# Patient Record
Sex: Female | Born: 1955 | Race: White | Hispanic: No | Smoking: Current every day smoker
Health system: Southern US, Community
[De-identification: ages and names within clinical notes are randomized; demographics above are authoritative.]

## PROBLEM LIST (undated history)

## (undated) HISTORY — PX: CYSTECTOMY: SUR359

## (undated) HISTORY — PX: APPENDECTOMY: SHX54

---

## 2018-05-29 ENCOUNTER — Other Ambulatory Visit: Payer: Self-pay | Admitting: Internal Medicine

## 2018-05-29 DIAGNOSIS — R921 Mammographic calcification found on diagnostic imaging of breast: Secondary | ICD-10-CM

## 2018-06-01 ENCOUNTER — Ambulatory Visit
Admission: RE | Admit: 2018-06-01 | Discharge: 2018-06-01 | Disposition: A | Discharge status: Home / Self Care | Payer: PRIVATE HEALTH INSURANCE | Source: Ambulatory Visit | Attending: Internal Medicine | Admitting: Internal Medicine

## 2018-06-01 DIAGNOSIS — R921 Mammographic calcification found on diagnostic imaging of breast: Secondary | ICD-10-CM

## 2019-10-18 IMAGING — MG STEREOTACTIC VACUUM ASSIST RIGHT
8 of 12 series · 8 of 16 positions shown · non-contrast
Comparison: Previous exams.

ADDENDUM:
Pathology revealed BENIGN STROMAL CALCIFICATIONS of the Right
breast, upper outer. This was found to be concordant by Dr. Rusere
Stein.

Pathology results will be discussed with the patient by telephone by
Clarence Zubia, [[email or web address]] at [facility] [[email or web address]].
The patient will be instructed to return for annual screening
mammography. Imaging and pathology reports were faxed to [facility] on
June 02, 2018.
Pathology results reported by Mirajuddin Ahammed, RN on 06/02/2018.
CLINICAL DATA: Stereotactic biopsy was recommended of
calcifications in the upper-outer right breast.
EXAM:
RIGHT BREAST STEREOTACTIC CORE NEEDLE BIOPSY

[R (1 of 6)]
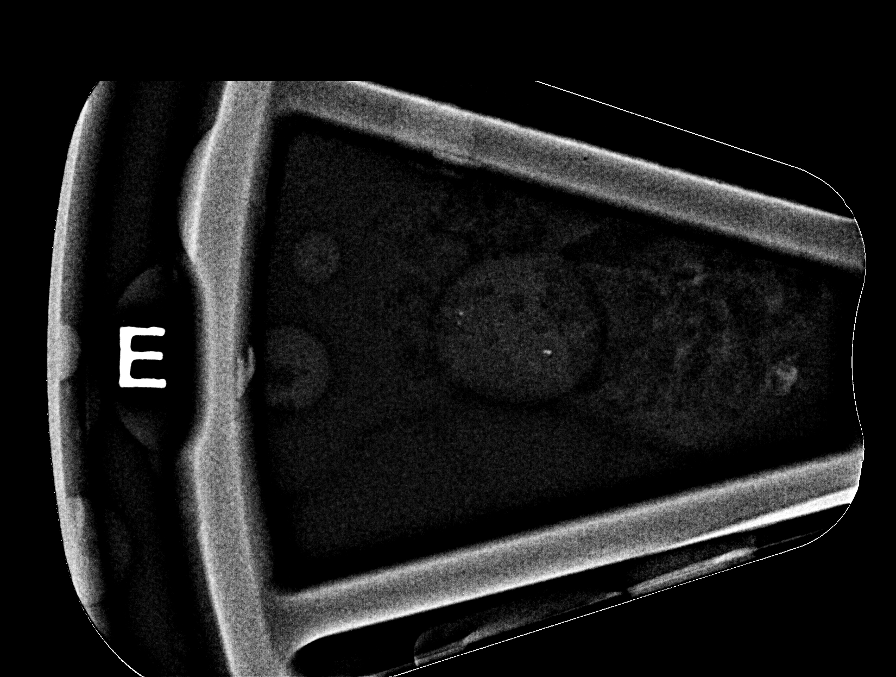

[R (2 of 6)]
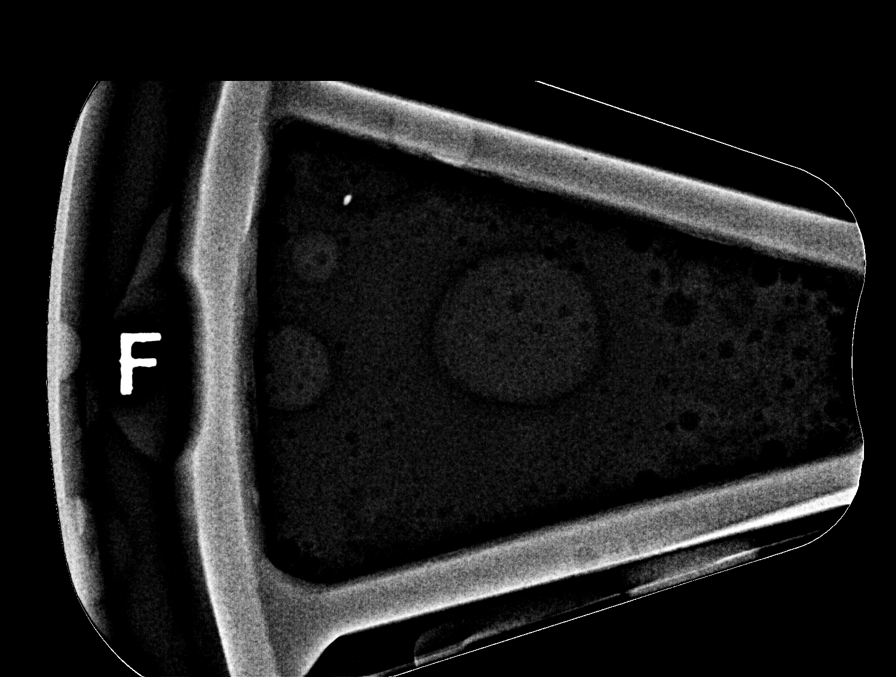

[R (3 of 6)]
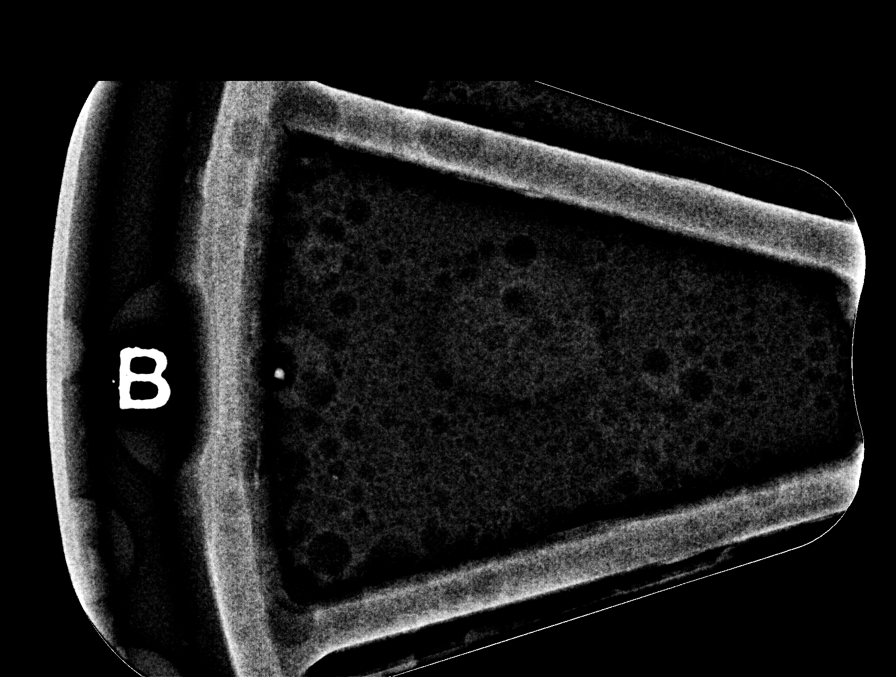

[R (4 of 6)]
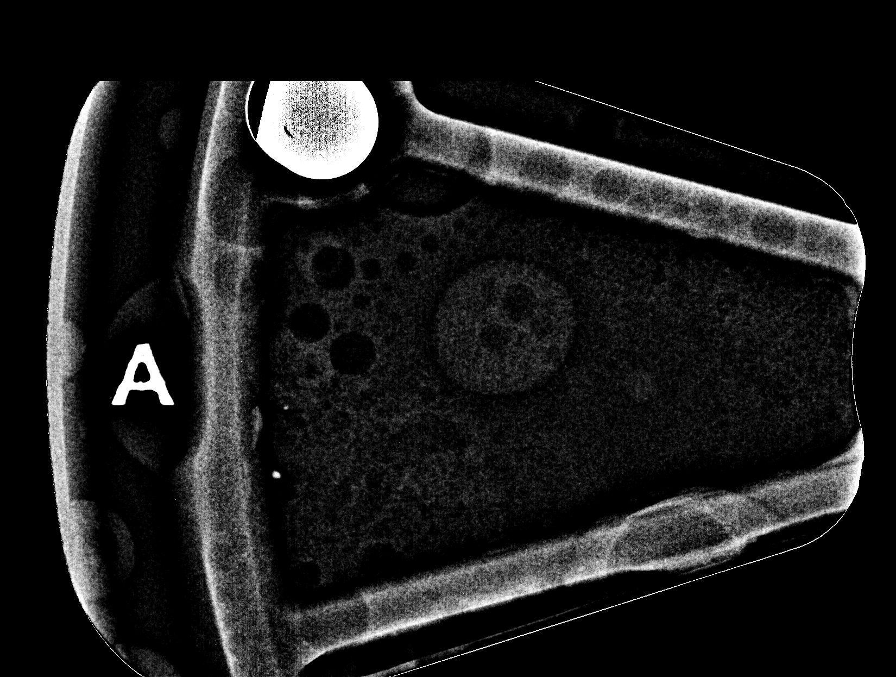

[R (5 of 6)]
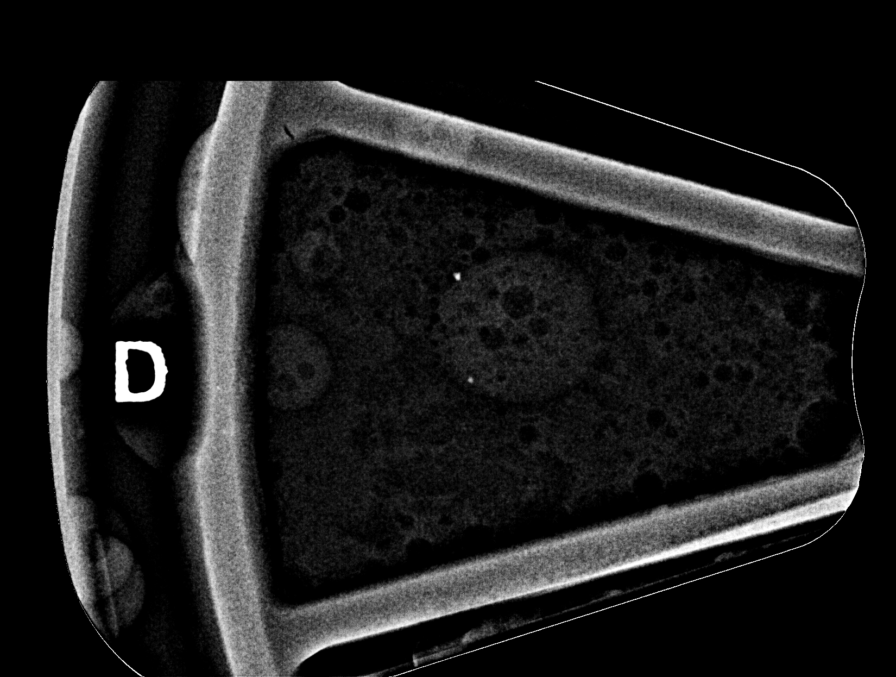

[R (6 of 6)]
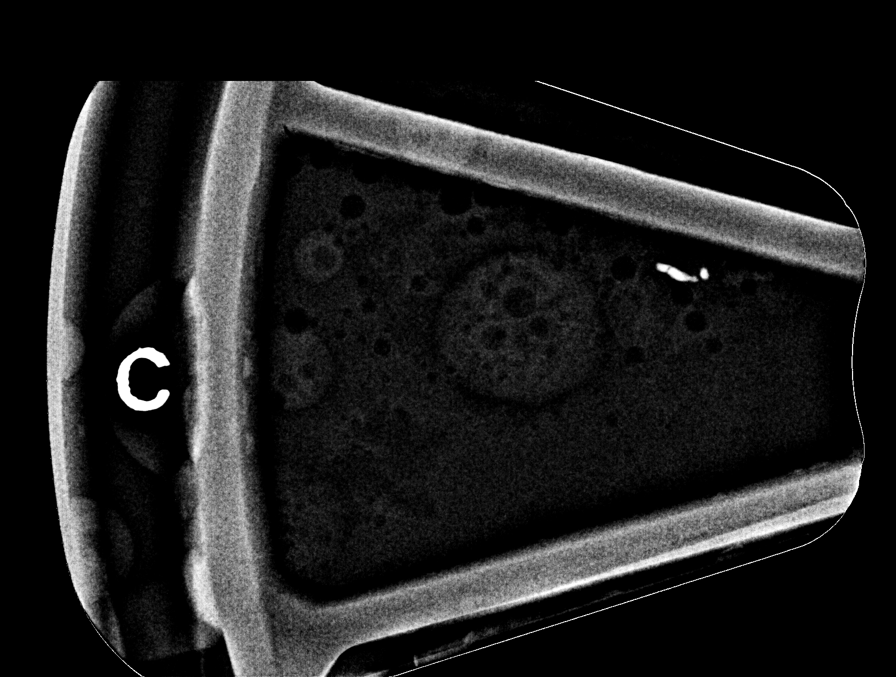

[R CC (1 of 2)]
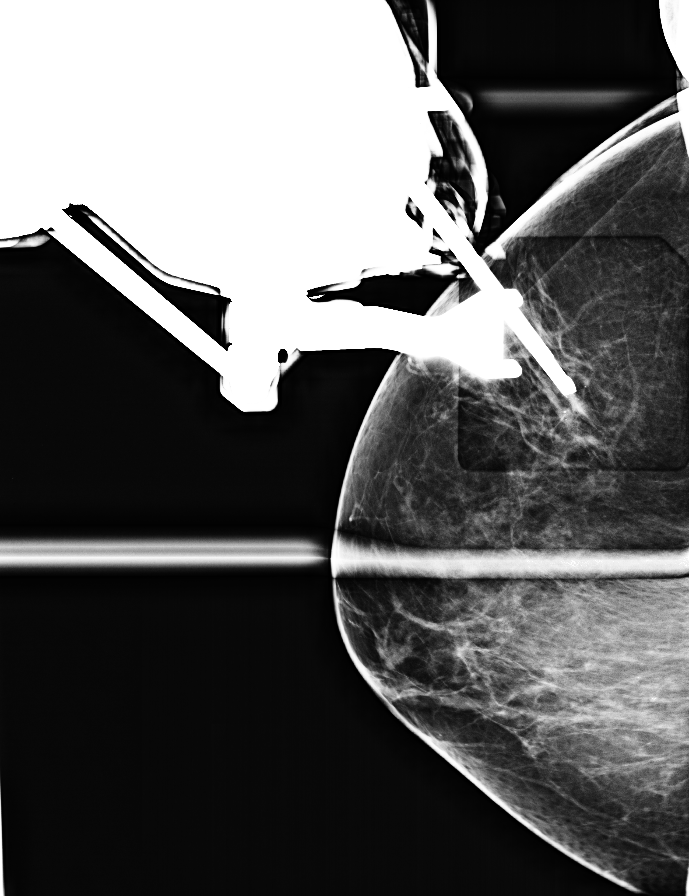

[R CC (2 of 2)]
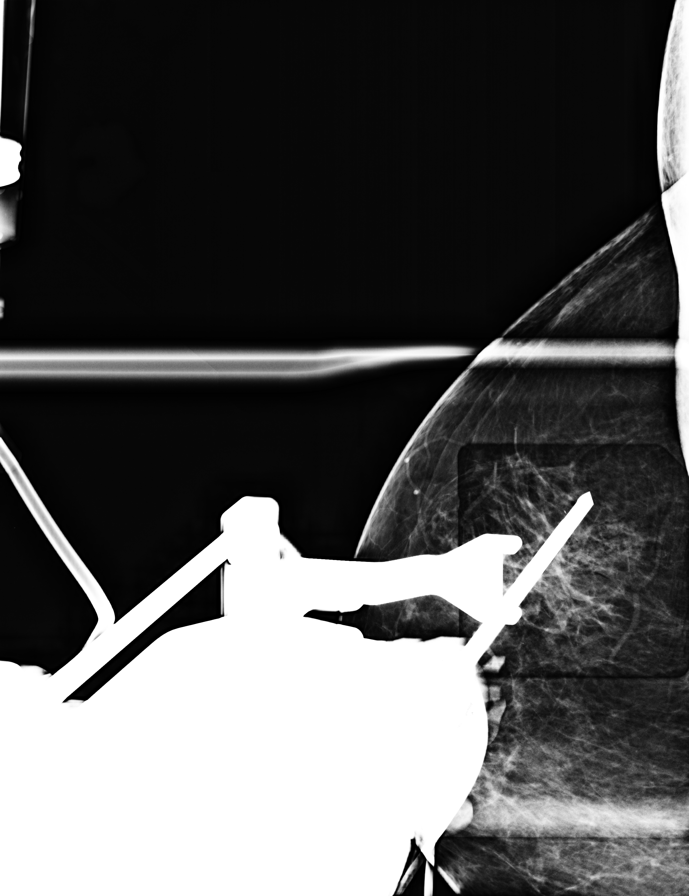

[8 of 16 positions shown; findings below may reference images not displayed]



Using sterile technique and 1% Lidocaine as local anesthetic, under
stereotactic guidance, a 9 gauge vacuum assisted device was used to
perform core needle biopsy of calcifications in the upper outer
right breast using a superior approach. Specimen radiograph was
performed showing multiple calcifications. Specimens with
calcifications are identified for pathology.

Lesion quadrant: Upper outer quadrant

At the conclusion of the procedure, a coil shaped tissue marker clip
was deployed into the biopsy cavity. Follow-up 2-view mammogram was
performed and dictated separately.
IMPRESSION: Stereotactic-guided biopsy of the right breast. No apparent
complications.

## 2022-03-22 ENCOUNTER — Ambulatory Visit (INDEPENDENT_AMBULATORY_CARE_PROVIDER_SITE_OTHER): Payer: Medicare HMO | Admitting: Internal Medicine

## 2022-03-22 ENCOUNTER — Encounter: Payer: Self-pay | Admitting: Internal Medicine

## 2022-03-22 VITALS — BP 128/60 | HR 66 | Temp 98.4°F | Resp 18 | Ht 64.5 in | Wt 218.8 lb

## 2022-03-22 DIAGNOSIS — J300 Vasomotor rhinitis: Secondary | ICD-10-CM | POA: Diagnosis not present

## 2022-03-22 DIAGNOSIS — L299 Pruritus, unspecified: Secondary | ICD-10-CM | POA: Diagnosis not present

## 2022-03-22 DIAGNOSIS — L503 Dermatographic urticaria: Secondary | ICD-10-CM | POA: Diagnosis not present

## 2022-03-22 MED ORDER — IPRATROPIUM BROMIDE 0.03 % NA SOLN: 2.0000 | Freq: Two times a day (BID) | NASAL | 12 refills | Status: AC

## 2022-03-22 NOTE — Progress Notes (Signed)
New Patient Note  RE: Cristina Little MRN: [id number] DOB: 30-Jan-1956 Date of Office Visit: 03/22/2022  Consult requested by: Nicola Girt, DO Primary care provider: Nicola Girt, DO  Chief Complaint: Pruritis  History of Present Illness: I had the pleasure of seeing Cristina Little for initial evaluation at the [facility] on 03/22/2022. She is a 66 y.o. female, who is referred here by Nicola Girt, DO for the evaluation of pruritus .  History obtained from patient  and  chart review .  Symptoms started a year ago and present as itching which migrates from arms, legs and back.  Denies any urticaria.  She presented to UC where she was prescribed "some sort of lotion" and zyrtec without good response.  Sometimes symptoms present with more of a burning sensation on arms and are associated with sensation of eyelid swelling and clear rhinnorhea. Symptoms will last a few weeks then resolve spontaneously.  She does endorse dermatographism.    Assessment and Plan: [name] is a 66 y.o. 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:  - hives can be from a number of different sources including infections, allergies, vibration, temperature, pressure among many others other possible causes - often an identifiable cause is not determined - some potential triggers include: stress, illness, NSAIDs, aspirin, hormonal changes - you do not have any red flag symptoms to make [place] concerned about secondary causes of hives, but we will screen for these for reassurance with: CBC w diff, CMP, tryptase, TSH,  hive panel, alpha-gal panel, inflammatory markers - approximately 50% of patients with chronic hives can have some associated swelling of the face/lips/eyelids (this is not a cause for alarm and does not typically progress onto systemic allergic reactions)  Therapy Plan: For Flares  - start zyrtec (cetirizine) 10mg  once daily - if hives are uncontrolled, increase zyrtec (cetirizine) to 10mg  twice daily - can increase or decrease dosing depending on symptom control to a maximum dose of 4 tablets of antihistamine daily. Wait until hives free for at least one month prior to decreasing dose.   - if hives are still uncontrolled with the above regimen, please arrange an appointment for discussion of Xolair (omalizumab)- an injectable medication for hives  Can use one of the following in place of zyrtec if desires: Claritin (loratadine) 10 mg, Xyzal (levocetirizine) 5 mg or Allegra (fexofenadine) 180 mg daily as needed  Nonallergic rhinitis: Moderately well controlled  - Testing today showed negative to all environmentals - Copy of test results provided.  - Start taking:  Nasal atrovent 1 spray per nostril as needed for runny nose  - You can use an extra dose of the antihistamine, if needed, for breakthrough symptoms.  - Consider nasal saline rinses 1-2 times daily to remove allergens from the nasal cavities as well as help with mucous clearance (this is especially helpful to do before the nasal sprays are given) - Consider allergy shots as a means of long-term control and can reduce lifetime use of medications  - Allergy shots "re-train" and "reset" the immune system to ignore environmental allergens and decrease the resulting immune response to those allergens (sneezing, itchy watery eyes, runny nose, nasal congestion,  etc).    - Allergy shots improve symptoms in 75-85%  - Allergy shots are the only potential permanent and disease modifying option  - We can discuss more at the next appointment if the  medications are not working for you.  Follow up: We will contact you with lab results otherwise follow-up in clinic in 3 months  Thank you so much for letting me partake in your care today.  Don't hesitate to reach out if you have any additional concerns!  Roney Marion, MD  [facility] [place] [place]    No follow-ups on file.  Meds ordered this encounter  Medications   ipratropium (ATROVENT) 0.03 % nasal spray    Sig: Place 2 sprays into both nostrils every 12 (twelve) hours.    Dispense:  30 mL    Refill:  12   Lab Orders         CBC With Differential         CMP14+EGFR         Chronic Urticaria         Tryptase         Thyroid antibodies         TSH         Sed Rate (ESR)         C-reactive protein      Other allergy screening: Asthma: no Rhino conjunctivitis: yes Food allergy: no Medication allergy: no Hymenoptera allergy: no Urticaria: yes Eczema:no History of recurrent infections suggestive of immunodeficency: no  Diagnostics: Skin Testing: Environmental allergy panel and select foods. Negative to environmental's and common foods  Results interpreted by myself and discussed with patient/family.  Airborne Adult Perc - 03/22/22 1529     Time Antigen Placed 1525    Allergen Manufacturer Lavella Hammock    Location Back    Number of Test 59    Panel 1 Select    1. Control-Buffer 50% Glycerol Negative    2. Control-Histamine 1 mg/ml 3+    3. Albumin saline Negative    4. [place] Negative    5. [place] Negative    6. Johnson Negative    7. Lupus Blue Negative    8. Meadow Fescue Negative    9. Perennial Rye Negative    10. Sweet Vernal Negative    11. Timothy Negative    12. Cocklebur Negative    13. Burweed Marshelder Negative    14. Ragweed, short Negative    15. Ragweed, Giant Negative    16. Plantain,  English Negative    17. Lamb's Quarters Negative    18. Sheep Sorrell Negative    19. Rough Pigweed Negative    20. Marsh Elder,  Rough Negative    21. Mugwort, Common Negative    22. Ash mix Negative    23. Birch mix Negative    24. Beech American Negative    25. Box, Elder Negative    26. Cedar, red Negative    27. Cottonwood, [place] Negative    28. Elm mix Negative    29. Hickory Negative    30. Maple mix Negative    31. Oak, [place] mix Negative    32. Pecan Pollen Negative    33. Pine mix Negative    34. Sycamore Eastern Negative    35. Denton, Black Pollen Negative    36. Alternaria alternata Negative    37. Cladosporium Herbarum Negative    38. Aspergillus mix Negative    39. Penicillium mix Negative  40. Bipolaris sorokiniana (Helminthosporium) Negative    41. Drechslera spicifera (Curvularia) Negative    42. Mucor plumbeus Negative    43. Fusarium moniliforme Negative    44. Aureobasidium pullulans (pullulara) Negative    45. Rhizopus oryzae Negative    46. Botrytis cinera Negative    47. Epicoccum nigrum Negative    48. Phoma betae Negative    49. Candida Albicans Negative    50. Trichophyton mentagrophytes Negative    51. Mite, D Farinae  5,000 AU/ml Negative    52. Mite, D Pteronyssinus  5,000 AU/ml Negative    53. Cat Hair 10,000 BAU/ml Negative    54.  Dog Epithelia Negative    55. Mixed Feathers Negative    56. Horse Epithelia Negative    57. Cockroach, German Negative    58. Mouse Negative    59. Tobacco Leaf Negative             Food Perc - 03/22/22 1529       Test Information   Time Antigen Placed 1525    Allergen Manufacturer Lavella Hammock    Location Back    Number of allergen test 10      Food   1. Peanut Negative    2. Soybean food Negative    3. Wheat, whole Negative    4. Sesame Negative    5. Milk, cow Negative    6. Egg White, chicken Negative    7. Casein Negative    8. Shellfish mix Negative    9. Fish mix Negative    10. Cashew Negative             Past Medical History: There are no problems to display for this patient.  History reviewed. No  pertinent past medical history. Past Surgical History: Past Surgical History:  Procedure Laterality Date   APPENDECTOMY     CESAREAN SECTION     CYSTECTOMY     breast   Medication List:  Current Outpatient Medications  Medication Sig Dispense Refill   aspirin EC 81 MG tablet Take 1 tablet by mouth daily.     famotidine (PEPCID) 20 MG tablet Take 1 tablet by mouth daily.     FERREX 150 150 MG capsule Take 150 mg by mouth daily.     gabapentin (NEURONTIN) 100 MG capsule TAKE ONE CAPSULE BY MOUTH THREE TIMES A DAY FOR PAIN     hydrochlorothiazide (HYDRODIURIL) 25 MG tablet Take 1 tablet by mouth daily.     ipratropium (ATROVENT) 0.03 % nasal spray Place 2 sprays into both nostrils every 12 (twelve) hours. 30 mL 12   metoprolol succinate (TOPROL-XL) 25 MG 24 hr tablet Take 25 mg by mouth daily.     rosuvastatin (CRESTOR) 10 MG tablet Take 10 mg by mouth at bedtime.     No current facility-administered medications for this visit.   Allergies: Allergies  Allergen Reactions   Bupropion Itching and Nausea And Vomiting   Sulfamethoxazole Nausea And Vomiting   Social History: Social History   Socioeconomic History   Marital status: Divorced    Spouse name: Not on file   Number of children: Not on file   Years of education: Not on file   Highest education level: Not on file  Occupational History   Not on file  Tobacco Use   Smoking status: Every Day    Packs/day: 1.00    Types: Cigarettes    Passive exposure: Current   Smokeless tobacco: Never  Vaping Use  Vaping Use: Never used  Substance and Sexual Activity   Alcohol use: Yes    Comment: occ   Drug use: Never   Sexual activity: Not on file  Other Topics Concern   Not on file  Social History Narrative   Not on file   Social Determinants of Health   Financial Resource Strain: Not on file  Food Insecurity: Not on file  Transportation Needs: Not on file  Physical Activity: Not on file  Stress: Not on file  Social  Connections: Not on file   Lives in a single-family home that is 67 years old.  There are no roaches in the house and bed is not 2 feet off the floor.  There are no dust mite precautions on better pillows.  She is not exposed to fumes, chemicals or dust.  There is no HEPA filter in the home and home [place] not near an interstate industrial area.. 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   Objective: BP 128/60   Pulse 66   Temp 98.4 F (36.9 C) (Temporal)   Resp 18   Ht 5' 4.5" (1.638 m)   Wt 218 lb 12.8 oz (99.2 kg)   SpO2 98%   BMI 36.98 kg/m  Body mass index is 36.98 kg/m.  General Appearance:  Alert, cooperative, no distress, appears stated age  Head:  Normocephalic, without obvious abnormality, atraumatic  Eyes:  Conjunctiva clear, EOM's intact  Nose: Nares normal, normal mucosa, no visible anterior polyps, and septum midline  Throat: Lips, tongue normal; teeth and gums normal, normal posterior oropharynx and no tonsillar exudate  Neck: Supple, symmetrical  Lungs:   clear to auscultation bilaterally, Respirations unlabored, no coughing  Heart:  regular rate and rhythm and no murmur, Appears well perfused  Extremities: No edema  Skin: Skin color, texture, turgor normal, no rashes or lesions on visualized portions of skin, diffuse xerosis   Neurologic: No gross deficits   The plan was reviewed with the patient/family, and all questions/concerned were addressed.  It was my pleasure to  see Naomi today and participate in her care. Please feel free to contact me with any questions or concerns.  Sincerely,  Roney Marion, MD Allergy & Immunology  [facility] office: [phone] [facility] office: [phone]

## 2022-03-22 NOTE — Patient Instructions (Addendum)
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:  - hives can be from a number of different sources including infections, allergies, vibration, temperature, pressure among many others other possible causes - often an identifiable cause is not determined - some potential triggers include: stress, illness, NSAIDs, aspirin, hormonal changes - you do not have any red flag symptoms to make [place] concerned about secondary causes of hives, but we will screen for these for reassurance with: CBC w diff, CMP, tryptase, TSH, hive panel, alpha-gal panel, inflammatory markers - approximately 50% of patients with chronic hives can have some associated swelling of the face/lips/eyelids (this is not a cause for alarm and does not typically progress onto systemic allergic reactions)  Therapy Plan: For Flares  - start zyrtec (cetirizine) 10mg  once daily - if hives are uncontrolled, increase zyrtec (cetirizine) to 10mg  twice daily - can increase or decrease dosing depending on symptom control to a maximum dose of 4 tablets of antihistamine daily. Wait until hives free for at least one month prior to decreasing dose.   - if hives are still uncontrolled with the above regimen, please arrange an appointment for discussion of Xolair (omalizumab)- an injectable medication for hives  Can use one of the following in place of zyrtec if desires: Claritin (loratadine) 10 mg, Xyzal (levocetirizine) 5 mg or Allegra (fexofenadine) 180 mg daily as needed  Nonallergic rhinitis: Moderately well controlled  - Testing today showed negative to all environmentals - Copy of test results provided.  - Start taking:  Nasal atrovent 1 spray per nostril as needed for runny nose  - You can use an extra dose of the antihistamine, if needed, for breakthrough symptoms.  - Consider nasal saline rinses 1-2 times daily to remove allergens from the nasal cavities as well as help with mucous clearance (this is especially helpful to do before the nasal  sprays are given) - Consider allergy shots as a means of long-term control and can reduce lifetime use of medications  - Allergy shots "re-train" and "reset" the immune system to ignore environmental allergens and decrease the resulting immune response to those allergens (sneezing, itchy watery eyes, runny nose, nasal congestion, etc).    - Allergy shots improve symptoms in 75-85%  - Allergy shots are the only potential permanent and disease modifying option  - We can discuss more at the next appointment if the medications are not working for you.  Follow up: We will contact you with lab results otherwise follow-up in clinic in 3 months  Thank you so much for letting me partake in your care today.  Don't hesitate to reach out if you have any additional concerns!  , MD  [facility] [place] [place]

## 2022-03-26 LAB — CBC WITH DIFFERENTIAL
Basophils Absolute: 0.1 10*3/uL (ref 0.0–0.2)
Basos: 1 %
EOS (ABSOLUTE): 0.2 10*3/uL (ref 0.0–0.4)
Eos: 3 %
Hematocrit: 33.2 % — ABNORMAL LOW (ref 34.0–46.6)
Hemoglobin: 10.8 g/dL — ABNORMAL LOW (ref 11.1–15.9)
Immature Grans (Abs): 0 10*3/uL (ref 0.0–0.1)
Immature Granulocytes: 0 %
Lymphocytes Absolute: 1.9 10*3/uL (ref 0.7–3.1)
Lymphs: 24 %
MCH: 27 pg (ref 26.6–33.0)
MCHC: 32.5 g/dL (ref 31.5–35.7)
MCV: 83 fL (ref 79–97)
Monocytes Absolute: 0.5 10*3/uL (ref 0.1–0.9)
Monocytes: 7 %
Neutrophils Absolute: 5 10*3/uL (ref 1.4–7.0)
Neutrophils: 65 %
RBC: 4 x10E6/uL (ref 3.77–5.28)
RDW: 13.5 % (ref 11.7–15.4)
WBC: 7.7 10*3/uL (ref 3.4–10.8)

## 2022-03-26 LAB — CMP14+EGFR
ALT: 18 IU/L (ref 0–32)
AST: 17 IU/L (ref 0–40)
Albumin/Globulin Ratio: 1.7 (ref 1.2–2.2)
Albumin: 4.3 g/dL (ref 3.9–4.9)
Alkaline Phosphatase: 84 IU/L (ref 44–121)
BUN/Creatinine Ratio: 16 (ref 12–28)
BUN: 20 mg/dL (ref 8–27)
Bilirubin Total: 0.3 mg/dL (ref 0.0–1.2)
CO2: 24 mmol/L (ref 20–29)
Calcium: 9.2 mg/dL (ref 8.7–10.3)
Chloride: 102 mmol/L (ref 96–106)
Creatinine, Ser: 1.28 mg/dL — ABNORMAL HIGH (ref 0.57–1.00)
Globulin, Total: 2.6 g/dL (ref 1.5–4.5)
Glucose: 93 mg/dL (ref 70–99)
Potassium: 4 mmol/L (ref 3.5–5.2)
Sodium: 141 mmol/L (ref 134–144)
Total Protein: 6.9 g/dL (ref 6.0–8.5)
eGFR: 46 mL/min/{1.73_m2} — ABNORMAL LOW (ref >59)

## 2022-03-26 LAB — CHRONIC URTICARIA: cu index: <3.2 (ref <10)

## 2022-03-26 LAB — C-REACTIVE PROTEIN: CRP: 9 mg/L (ref 0–10)

## 2022-03-26 LAB — TSH: TSH: 2.83 u[IU]/mL (ref 0.450–4.500)

## 2022-03-26 LAB — TRYPTASE: Tryptase: 9.5 ug/L (ref 2.2–13.2)

## 2022-03-26 LAB — THYROID ANTIBODIES
Thyroglobulin Antibody: 2.9 IU/mL — ABNORMAL HIGH (ref 0.0–0.9)
Thyroperoxidase Ab SerPl-aCnc: 17 IU/mL (ref 0–34)

## 2022-03-26 LAB — SEDIMENTATION RATE: Sed Rate: 20 mm/hr (ref 0–40)

## 2022-03-26 NOTE — Progress Notes (Signed)
Hive blood work returned fairly normal.  Her markers for kidney function were elevated but at the patients baseline.  Can someone let patient know? Thanks!

## 2022-06-22 ENCOUNTER — Ambulatory Visit: Payer: PRIVATE HEALTH INSURANCE | Admitting: Internal Medicine
